# Patient Record
Sex: Female | Born: 1960 | Race: Black or African American | Hispanic: No | Marital: Married | Smoking: Never smoker
Health system: Southern US, Community
[De-identification: ages and names within clinical notes are randomized; demographics above are authoritative.]

## PROBLEM LIST (undated history)

## (undated) DIAGNOSIS — I10 Essential (primary) hypertension: Secondary | ICD-10-CM

## (undated) DIAGNOSIS — E119 Type 2 diabetes mellitus without complications: Secondary | ICD-10-CM

## (undated) HISTORY — DX: Essential (primary) hypertension: I10

## (undated) HISTORY — DX: Type 2 diabetes mellitus without complications: E11.9

## (undated) HISTORY — PX: BREAST BIOPSY: SHX20

---

## 2006-03-14 ENCOUNTER — Ambulatory Visit: Payer: Self-pay

## 2006-03-25 ENCOUNTER — Ambulatory Visit: Payer: Self-pay

## 2006-10-15 ENCOUNTER — Ambulatory Visit: Payer: Self-pay

## 2007-04-22 ENCOUNTER — Ambulatory Visit: Payer: Self-pay

## 2007-04-30 ENCOUNTER — Ambulatory Visit: Payer: Self-pay | Admitting: General Surgery

## 2007-09-14 IMAGING — MG MM ADDITIONAL VIEWS AT NO CHARGE
1 series · 5 of 5 positions shown · non-contrast
Comparison: none

REASON FOR EXAM: Right breast nodularity
                         US, if needed
COMMENTS:

[R ML · right · 5 of 5 slices shown]
[im 1/5]
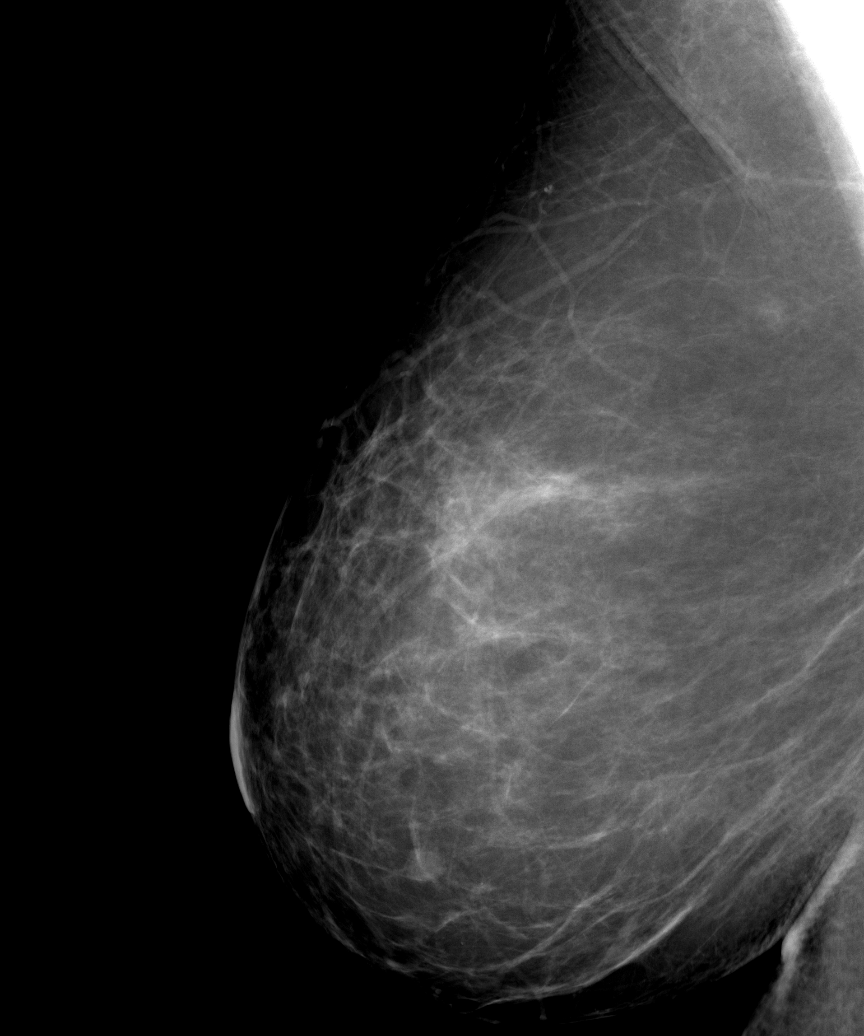
[im 2/5]
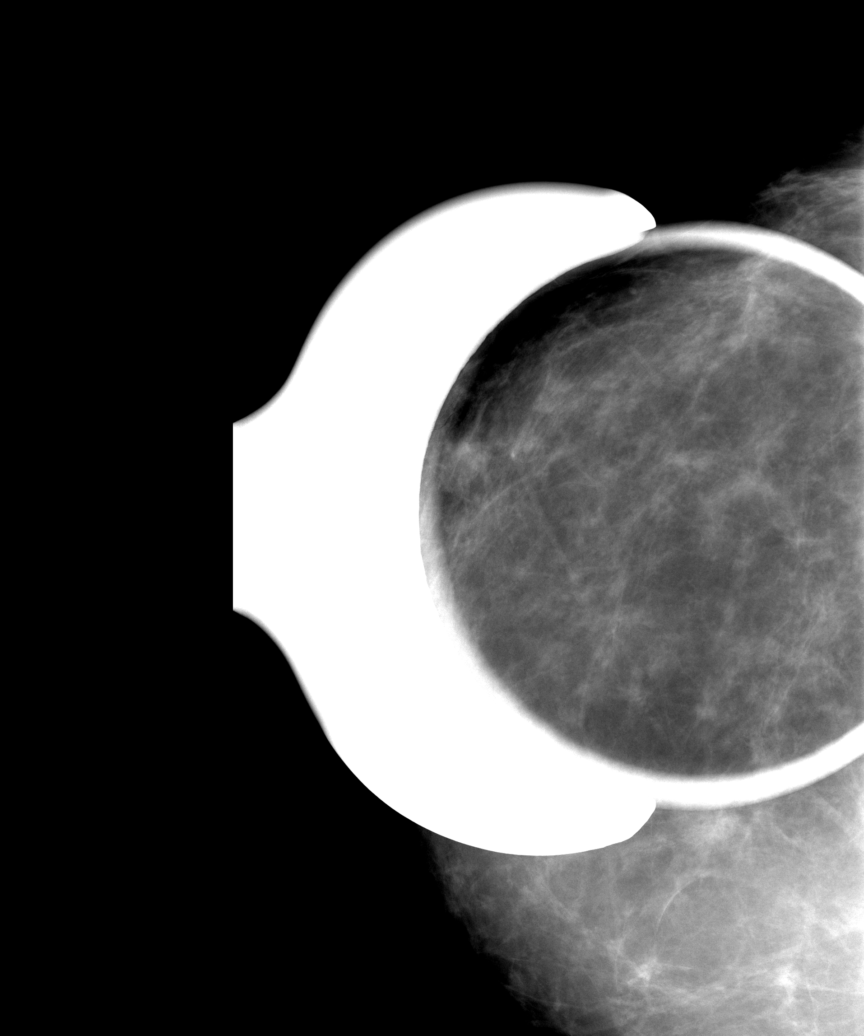
[im 3/5]
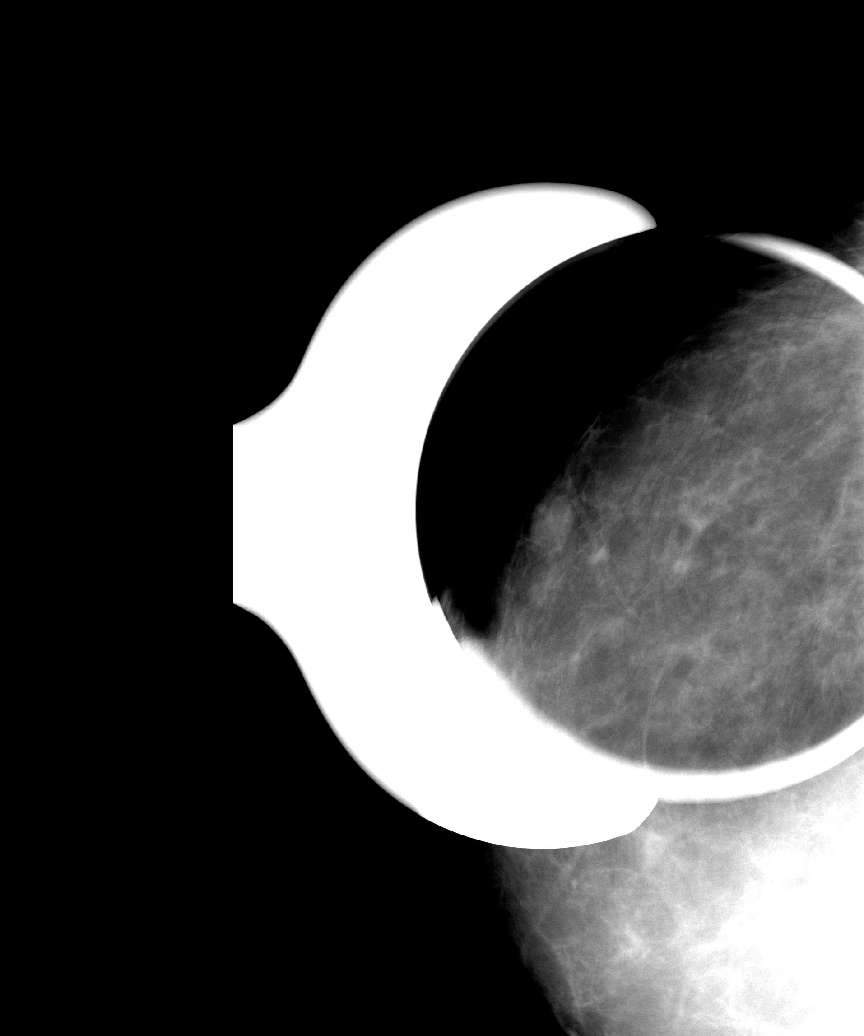
[im 4/5]
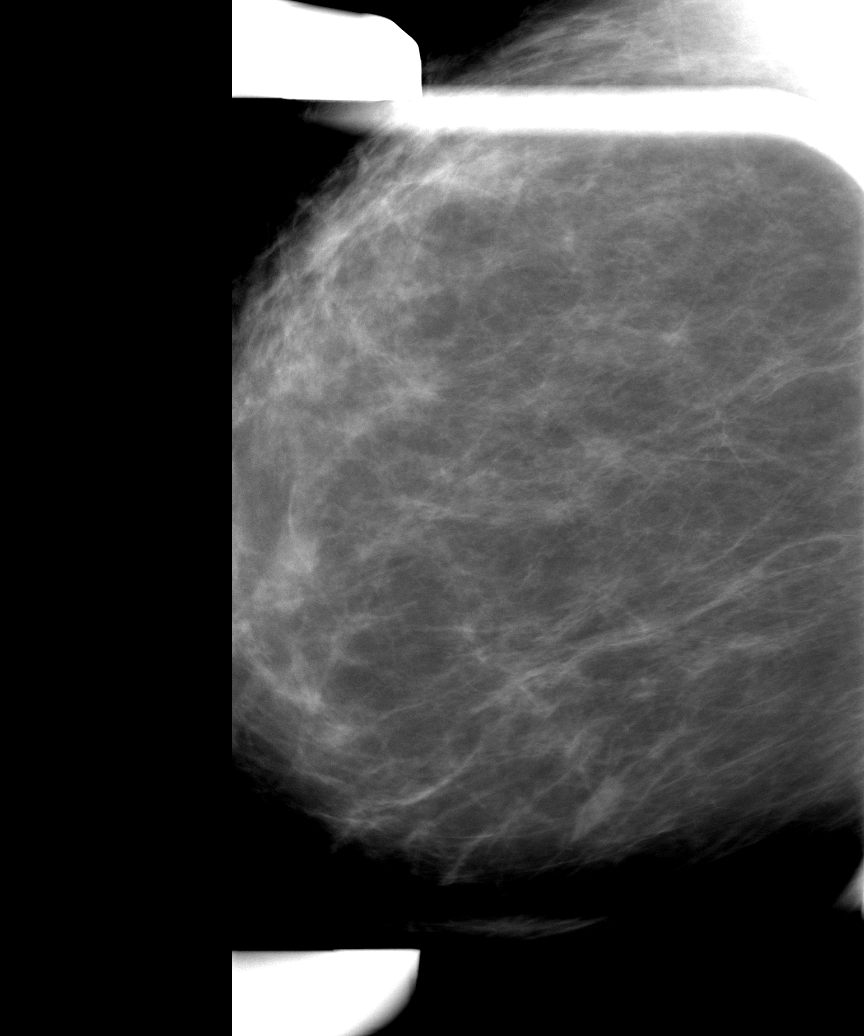
[im 5/5]
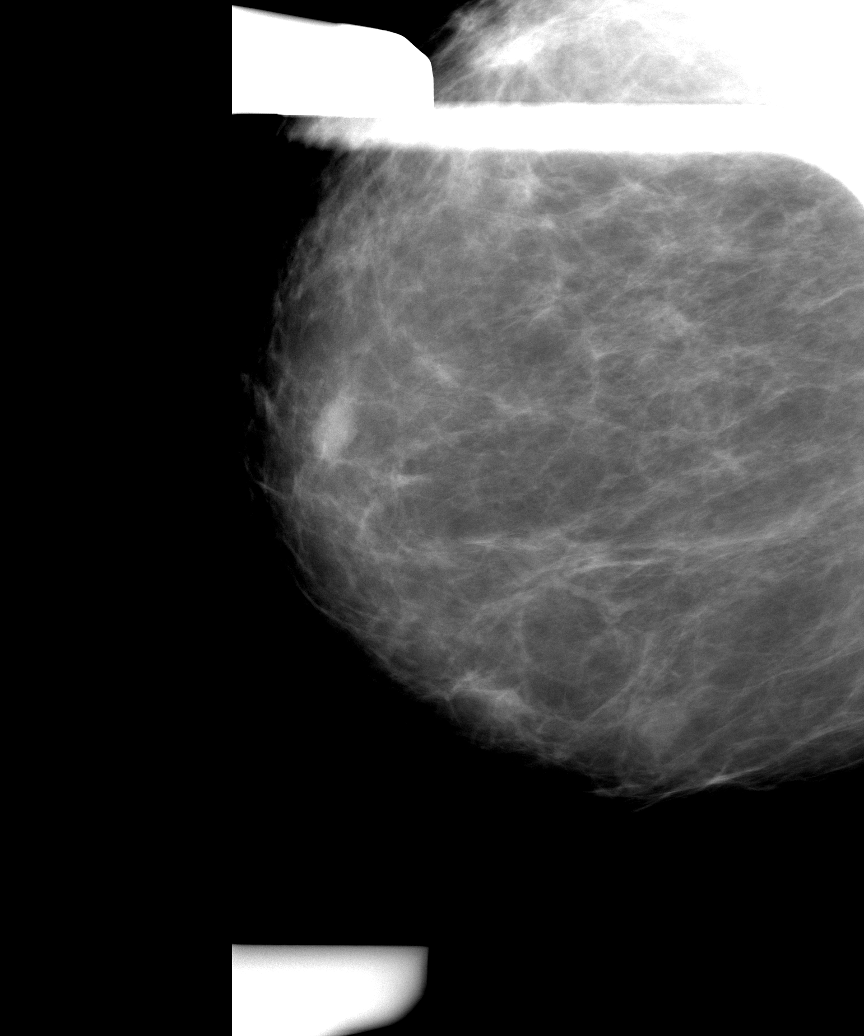

[5 of 5 positions shown; findings below may reference images not displayed]

PROCEDURE:     MAM - MAM DIG ADDVIEWS RT SCR  - March 25, 2006  [DATE]

RESULT:     Reference is made to a study of 03/14/2006 at which time
nodularity was noted in the slightly lower and outer aspect of the
periareolar region on the RIGHT. Spot magnification compression views and a
true lateral film do reveal a persistent nodule in this region today.
Ultrasound reveals a solid appearing nodule measuring approximately 6.0 mm
in dimension with some distal shadowing at approximately the 7 o'clock
position.
IMPRESSION: There is an indeterminate, solid appearing nodule on the
RIGHT in the anterior aspect of the breast in the lower outer periareolar
region. Surgical consultation is recommended. The structure is felt to be
too superficial for Stereotactic Biopsy but could be biopsied using
ultrasound guidance.

BI-RADS: Category 4 - Suspicious Abnormality. Surgical consultation is
recommended.

Thank you for this opportunity to contribute to the care of your patient.

A NEGATIVE MAMMOGRAM REPORT DOES NOT PRECLUDE BIOPSY OR OTHER EVALUATION OF
A CLINICALLY PALPABLE OR OTHERWISE SUSPICIOUS MASS OR LESION. BREAST CANCER
MAY NOT BE DETECTED BY MAMMOGRAPHY IN UP TO 10% OF CASES.

## 2010-05-18 ENCOUNTER — Emergency Department: Payer: Self-pay | Admitting: Emergency Medicine

## 2011-01-11 ENCOUNTER — Emergency Department: Payer: Self-pay | Admitting: *Deleted

## 2013-02-11 ENCOUNTER — Ambulatory Visit: Payer: Self-pay

## 2013-03-06 ENCOUNTER — Ambulatory Visit: Payer: Self-pay

## 2013-03-25 ENCOUNTER — Ambulatory Visit: Payer: Self-pay

## 2013-03-27 LAB — PATHOLOGY REPORT

## 2013-09-25 ENCOUNTER — Ambulatory Visit: Payer: Self-pay | Admitting: Surgery

## 2016-10-29 LAB — HEMOGLOBIN A1C: Hemoglobin A1C: 6.1

## 2016-10-29 LAB — LIPID PANEL: HDL: 105 — AB (ref 35–70)

## 2017-03-20 ENCOUNTER — Encounter: Payer: Self-pay | Admitting: *Deleted

## 2017-03-20 ENCOUNTER — Ambulatory Visit: Payer: Self-pay | Attending: Oncology | Admitting: *Deleted

## 2017-03-20 ENCOUNTER — Ambulatory Visit
Admission: RE | Admit: 2017-03-20 | Discharge: 2017-03-20 | Disposition: A | Discharge status: Home / Self Care | Payer: Self-pay | Source: Ambulatory Visit | Attending: Oncology | Admitting: Oncology

## 2017-03-20 ENCOUNTER — Encounter (INDEPENDENT_AMBULATORY_CARE_PROVIDER_SITE_OTHER): Payer: Self-pay

## 2017-03-20 VITALS — BP 152/81 | HR 63 | Temp 97.0°F | Resp 18 | Ht 64.0 in | Wt 181.0 lb

## 2017-03-20 DIAGNOSIS — Z0000 Encounter for general adult medical examination without abnormal findings: Secondary | ICD-10-CM

## 2017-03-20 NOTE — Patient Instructions (Signed)
HPV Test The human papillomavirus (HPV) test is used to look for high-risk types of HPV infection. HPV is a group of about 100 viruses. Many of these viruses cause growths on, in, or around the genitals. Most HPV viruses cause infections that usually go away without treatment. However, HPV types 6, 11, 16, and 18 are considered high-risk types of HPV that can increase your risk of cancer of the cervix or anus if the infection is left untreated. An HPV test identifies the DNA (genetic) strands of the HPV infection, so it is also referred to as the HPV DNA test. Although HPV is found in both males and females, the HPV test is only used to screen for increased cancer risk in females:  With an abnormal Pap test.  After treatment of an abnormal Pap test.  Between the ages of 30 and 65.  After treatment of a high-risk HPV infection.  The HPV test may be done at the same time as a pelvic exam and Pap test in females over the age of 30. Both the HPV test and Pap test require a sample of cells from the cervix. How do I prepare for this test?  Do not douche or take a bath for 24-48 hours before the test or as directed by your health care provider.  Do not have sex for 24-48 hours before the test or as directed by your health care provider.  You may be asked to reschedule the test if you are menstruating.  You will be asked to urinate before the test. What do the results mean? It is your responsibility to obtain your test results. Ask the lab or department performing the test when and how you will get your results. Talk with your health care provider if you have any questions about your results. Your result will be negative or positive. Meaning of Negative Test Results A negative HPV test result means that no HPV was found, and it is very likely that you do not have HPV. Meaning of Positive Test Results A positive HPV test result indicates that you have HPV.  If your test result shows the presence  of any high-risk HPV strains, you may have an increased risk of developing cancer of the cervix or anus if the infection is left untreated.  If any low-risk HPV strains are found, you are not likely to have an increased risk of cancer.  Discuss your test results with your health care provider. He or she will use the results to make a diagnosis and determine a treatment plan that is right for you. Talk with your health care provider to discuss your results, treatment options, and if necessary, the need for more tests. Talk with your health care provider if you have any questions about your results. This information is not intended to replace advice given to you by your health care provider. Make sure you discuss any questions you have with your health care provider. Document Released: 04/13/2004 Document Revised: 11/23/2015 Document Reviewed: 08/04/2013 Elsevier Interactive Patient Education  2018 Elsevier Inc.   Gave patient hand-out, Women Staying Healthy, Active and Well from [facility], with education on breast health, pap smears, heart and colon health.  

## 2017-03-20 NOTE — Progress Notes (Signed)
Subjective:     Patient ID: Brittany Callahan, female   DOB: February 28, 1961, 56 y.o.   MRN: [id number]  HPI   Review of Systems     Objective:   Physical Exam  Pulmonary/Chest: Right breast exhibits no inverted nipple, no mass, no nipple discharge, no skin change and no tenderness. Left breast exhibits tenderness. Left breast exhibits no inverted nipple, no mass, no nipple discharge and no skin change. Breasts are symmetrical.    Abdominal: There is no splenomegaly or hepatomegaly.  Genitourinary: No labial fusion. There is no rash, tenderness, lesion or injury on the right labia. There is no rash, tenderness, lesion or injury on the left labia. Cervix exhibits no motion tenderness, no discharge and no friability. Right adnexum displays no mass, no tenderness and no fullness. Left adnexum displays no mass, no tenderness and no fullness. No erythema, tenderness or bleeding in the vagina. No foreign body in the vagina. No signs of injury around the vagina. No vaginal discharge found.       Assessment:     56 year [facility] Black [facility].  Clinical breast exam unremarkable.  Taught self breast awareness.  Specimen collected for pap smear without difficulty.   Blood pressure elevated at 152/81.  She had just taken her blood pressure meds. She is to recheck her blood pressure at [facility] or [facility], and if remains higher than 140/90 she is to follow-up with her primary care provider.  Hand out on hypertention given to patient. Patient has been screened for eligibility.  She does not have any insurance, Medicare or Medicaid.  She also meets financial eligibility.  Hand-out given on the Affordable Care Act.    Plan:     Screening mammogram ordered.  Specimen for pap sent to the lab.  Patient would like me to call her with her results.  Will follow-up per [facility] protocol.

## 2017-03-22 LAB — PAP LB AND HPV HIGH-RISK
HPV, HIGH-RISK: NEGATIVE
PAP SMEAR COMMENT: 0

## 2017-03-27 ENCOUNTER — Encounter: Payer: Self-pay | Admitting: *Deleted

## 2017-03-27 NOTE — Progress Notes (Signed)
Letter mailed to inform patient of her normal mammogram and pap smear.  Next mammo due in one year and pap smear in 5 years.  [facility] [place].

## 2017-11-02 ENCOUNTER — Encounter: Payer: Self-pay | Admitting: Internal Medicine

## 2017-11-02 ENCOUNTER — Ambulatory Visit: Payer: Self-pay | Admitting: Internal Medicine

## 2017-11-02 VITALS — BP 169/80 | HR 77 | Temp 99.1°F | Ht 68.0 in | Wt 185.0 lb

## 2017-11-02 DIAGNOSIS — E119 Type 2 diabetes mellitus without complications: Secondary | ICD-10-CM | POA: Insufficient documentation

## 2017-11-02 DIAGNOSIS — E118 Type 2 diabetes mellitus with unspecified complications: Secondary | ICD-10-CM

## 2017-11-02 DIAGNOSIS — I10 Essential (primary) hypertension: Secondary | ICD-10-CM

## 2017-11-02 NOTE — Progress Notes (Signed)
Patient came for blood sugar check she is diabetic and is compliant with metformin. Blood glucose is 90 . Will check HBA1C and urine for microalbumin. Complains of polyuria will check urinalysis to r/o bladder infection.  Pt advised to continue her current medications.  Pt has an eye exam t6 months ago and FHX of mom had colon CA. Will be scheduled for colonoscopy. Pt reports NL PAP and mammogram.  HEENT: NCAT VS:WNL, NAD Chest: NL S1 S2, with grade 2/6 left sternal border murur. No carotid bruit no JVD. Ext: NL sensation to sharp and dull, no peripheral edema, pulses are 2+ and equal. Lungs: CTA B/L F/U in 2 weeks Subjective:    Patient here for follow-up of elevated blood pressure.  She not exercising and is not adherent to a low-salt diet.  Blood pressure is well controlled at home. Cardiac symptoms: none. Patient denies: chest pain. Cardiovascular risk factors: diabetes mellitus and hypertension. Use of agents associated with hypertension: NSAIDS. History of target organ damage: none.  The following portions of the patient's history were reviewed and updated as appropriate: allergies, current medications, past medical history, past social history and problem list.   Review of Systems Pertinent items noted in HPI and remainder of comprehensive ROS otherwise negative.     Objective:    CV exam performed today.   Assessment:    Hypertension, stage 2 . Evidence of target organ damage: none.    Plan:    Dietary sodium restriction. Regular aerobic exercise. Check blood pressures daily and record. Follow up: 2 week and as needed.

## 2017-11-04 ENCOUNTER — Other Ambulatory Visit: Payer: Self-pay | Admitting: Internal Medicine

## 2017-11-05 LAB — URINALYSIS, ROUTINE W REFLEX MICROSCOPIC
Bilirubin, UA: NEGATIVE
Glucose, UA: NEGATIVE
Ketones, UA: NEGATIVE
Leukocytes, UA: NEGATIVE
Nitrite, UA: NEGATIVE
Protein, UA: NEGATIVE
RBC, UA: NEGATIVE
Specific Gravity, UA: 1.018 (ref 1.005–1.030)
Urobilinogen, Ur: 1 mg/dL (ref 0.2–1.0)
pH, UA: 7 (ref 5.0–7.5)

## 2017-11-05 LAB — HGB A1C W/O EAG: Hgb A1c MFr Bld: 6.1 % — ABNORMAL HIGH (ref 4.8–5.6)

## 2017-11-16 ENCOUNTER — Other Ambulatory Visit: Payer: Self-pay | Admitting: Internal Medicine

## 2017-12-07 ENCOUNTER — Other Ambulatory Visit: Payer: Self-pay | Admitting: Internal Medicine

## 2017-12-21 ENCOUNTER — Other Ambulatory Visit: Payer: Self-pay | Admitting: Adult Health

## 2017-12-21 ENCOUNTER — Encounter: Payer: Self-pay | Admitting: Adult Health

## 2017-12-21 VITALS — BP 134/71 | HR 65 | Temp 98.2°F | Ht 65.0 in | Wt 182.4 lb

## 2017-12-21 DIAGNOSIS — I10 Essential (primary) hypertension: Secondary | ICD-10-CM

## 2017-12-21 DIAGNOSIS — I152 Hypertension secondary to endocrine disorders: Secondary | ICD-10-CM | POA: Insufficient documentation

## 2017-12-21 DIAGNOSIS — R7303 Prediabetes: Secondary | ICD-10-CM | POA: Insufficient documentation

## 2017-12-21 MED ORDER — METFORMIN HCL 500 MG PO TABS: 500.0000 mg | ORAL_TABLET | Freq: Every day | ORAL | 6 refills | Status: AC

## 2017-12-21 MED ORDER — LISINOPRIL-HYDROCHLOROTHIAZIDE 20-12.5 MG PO TABS: 1.0000 | ORAL_TABLET | Freq: Every day | ORAL | 6 refills | Status: AC

## 2017-12-21 NOTE — Progress Notes (Signed)
Brittany Callahan is an 57 y.o. Callahan.    Chief Complaint: follow up on prediabetes and HTN  HPI: Brittany Callahan here for follow up on prediabetes and HTN  Past Medical History:  Diagnosis Date  . Diabetes mellitus without complication (HCC)   . High blood pressure     Past Surgical History:  Procedure Laterality Date  . BREAST BIOPSY Left 03/25/2017   PAPILLARY APOCRINE CHANGE INVOLVING A MICROCYST CLUSTER    Family History  Problem Relation Age of Onset  . Dementia Mother   . Diabetes Mother   . Hypertension Mother    Social History:  reports that she has never smoked. She quit smokeless tobacco use about 28 years ago.  Her smokeless tobacco use included chew. Her alcohol and drug histories are not on file.  Allergies:  Allergies  Allergen Reactions  . Other     Seasonal     (Not in a hospital admission)  No results found for this or any previous visit (from the past 48 hour(s)). No results found.  Review of Systems  Constitutional: Negative.   Respiratory: Negative.   Cardiovascular: Negative.   Gastrointestinal: Negative.   Musculoskeletal: Negative.   Skin: Negative.   Neurological: Negative.   Endo/Heme/Allergies: Negative.   Psychiatric/Behavioral: Negative.     Blood pressure 134/71, pulse 65, temperature 98.2 F (36.8 C), height 5\' 5"  (1.651 m), weight 182 lb 6.4 oz (82.7 kg), SpO2 98 %.   Physical Exam  Constitutional: She is oriented to person, place, and time. She appears well-developed and well-nourished. No distress.  HENT:  Head: Normocephalic and atraumatic.  Neck: Normal range of motion. Neck supple.  Cardiovascular: Normal rate and regular rhythm.  Respiratory: Breath sounds normal.  GI: Soft.  Musculoskeletal: Normal range of motion.  Neurological: She is alert and oriented to person, place, and time.  Skin: Skin is dry.  Psychiatric: She has a normal mood and affect. Her behavior is normal. Judgment and thought content normal.      Assessment/Plan  1. Prediabetes Slightly hypoglycemic this morning. Pt has not eaten anything. Instructed on diet and exercise. Continue metformin for now. RTC in 6 months or sooner if needed  - metFORMIN (GLUCOPHAGE) 500 MG tablet; Take 1 tablet (500 mg total) by mouth daily with breakfast.  Dispense: 1 tablet; Refill: 6  2. Essential hypertension Stable on medication. She is compliant with medication. No reported SE. RTC in 6 months or sooner if needed.  - lisinopril-hydrochlorothiazide (PRINZIDE,ZESTORETIC) 20-12.5 MG tablet; Take 1 tablet by mouth daily.  Dispense: 1 tablet; Refill: 6   Brittany Parker, NP-C 12/21/17, 12:17 PM

## 2017-12-21 NOTE — Patient Instructions (Addendum)
Diabetes Mellitus and Exercise Exercising regularly is important for your overall health, especially when you have diabetes (diabetes mellitus). Exercising is not only about losing weight. It has many health benefits, such as increasing muscle strength and bone density and reducing body fat and stress. This leads to improved fitness, flexibility, and endurance, all of which result in better overall health. Exercise has additional benefits for people with diabetes, including:  Reducing appetite.  Helping to lower and control blood glucose.  Lowering blood pressure.  Helping to control amounts of fatty substances (lipids) in the blood, such as cholesterol and triglycerides.  Helping the body to respond better to insulin (improving insulin sensitivity).  Reducing how much insulin the body needs.  Decreasing the risk for heart disease by: ? Lowering cholesterol and triglyceride levels. ? Increasing the levels of good cholesterol. ? Lowering blood glucose levels.  What is my activity plan? Your health care provider or certified diabetes educator can help you make a plan for the type and frequency of exercise (activity plan) that works for you. Make sure that you:  Do at least 150 minutes of moderate-intensity or vigorous-intensity exercise each week. This could be brisk walking, biking, or water aerobics. ? Do stretching and strength exercises, such as yoga or weightlifting, at least 2 times a week. ? Spread out your activity over at least 3 days of the week.  Get some form of physical activity every day. ? Do not go more than 2 days in a row without some kind of physical activity. ? Avoid being inactive for more than 90 minutes at a time. Take frequent breaks to walk or stretch.  Choose a type of exercise or activity that you enjoy, and set realistic goals.  Start slowly, and gradually increase the intensity of your exercise over time.  What do I need to know about managing my  diabetes?  Check your blood glucose before and after exercising. ? If your blood glucose is higher than 240 mg/dL (13.3 mmol/L) before you exercise, check your urine for ketones. If you have ketones in your urine, do not exercise until your blood glucose returns to normal.  Know the symptoms of low blood glucose (hypoglycemia) and how to treat it. Your risk for hypoglycemia increases during and after exercise. Common symptoms of hypoglycemia can include: ? Hunger. ? Anxiety. ? Sweating and feeling clammy. ? Confusion. ? Dizziness or feeling light-headed. ? Increased heart rate or palpitations. ? Blurry vision. ? Tingling or numbness around the mouth, lips, or tongue. ? Tremors or shakes. ? Irritability.  Keep a rapid-acting carbohydrate snack available before, during, and after exercise to help prevent or treat hypoglycemia.  Avoid injecting insulin into areas of the body that are going to be exercised. For example, avoid injecting insulin into: ? The arms, when playing tennis. ? The legs, when jogging.  Keep records of your exercise habits. Doing this can help you and your health care provider adjust your diabetes management plan as needed. Write down: ? Food that you eat before and after you exercise. ? Blood glucose levels before and after you exercise. ? The type and amount of exercise you have done. ? When your insulin is expected to peak, if you use insulin. Avoid exercising at times when your insulin is peaking.  When you start a new exercise or activity, work with your health care provider to make sure the activity is safe for you, and to adjust your insulin, medicines, or food intake as needed.    Drink plenty of water while you exercise to prevent dehydration or heat stroke. Drink enough fluid to keep your urine clear or pale yellow. This information is not intended to replace advice given to you by your health care provider. Make sure you discuss any questions you have with  your health care provider. Document Released: 06/09/2003 Document Revised: 10/07/2015 Document Reviewed: 08/29/2015 Elsevier Interactive Patient Education  2018 Elsevier Inc.    Diabetes Mellitus and Nutrition When you have diabetes (diabetes mellitus), it is very important to have healthy eating habits because your blood sugar (glucose) levels are greatly affected by what you eat and drink. Eating healthy foods in the appropriate amounts, at about the same times every day, can help you: Control your blood glucose. Lower your risk of heart disease. Improve your blood pressure. Reach or maintain a healthy weight.  Every person with diabetes is different, and each person has different needs for a meal plan. Your health care provider may recommend that you work with a diet and nutrition specialist (dietitian) to make a meal plan that is best for you. Your meal plan may vary depending on factors such as: The calories you need. The medicines you take. Your weight. Your blood glucose, blood pressure, and cholesterol levels. Your activity level. Other health conditions you have, such as heart or kidney disease.  How do carbohydrates affect me? Carbohydrates affect your blood glucose level more than any other type of food. Eating carbohydrates naturally increases the amount of glucose in your blood. Carbohydrate counting is a method for keeping track of how many carbohydrates you eat. Counting carbohydrates is important to keep your blood glucose at a healthy level, especially if you use insulin or take certain oral diabetes medicines. It is important to know how many carbohydrates you can safely have in each meal. This is different for every person. Your dietitian can help you calculate how many carbohydrates you should have at each meal and for snack. Foods that contain carbohydrates include: Bread, cereal, rice, pasta, and crackers. Potatoes and corn. Peas, beans, and lentils. Milk and  yogurt. Fruit and juice. Desserts, such as cakes, cookies, ice cream, and candy.  How does alcohol affect me? Alcohol can cause a sudden decrease in blood glucose (hypoglycemia), especially if you use insulin or take certain oral diabetes medicines. Hypoglycemia can be a life-threatening condition. Symptoms of hypoglycemia (sleepiness, dizziness, and confusion) are similar to symptoms of having too much alcohol. If your health care provider says that alcohol is safe for you, follow these guidelines: Limit alcohol intake to no more than 1 drink per day for nonpregnant women and 2 drinks per day for men. One drink equals 12 oz of beer, 5 oz of wine, or 1 oz of hard liquor. Do not drink on an empty stomach. Keep yourself hydrated with water, diet soda, or unsweetened iced tea. Keep in mind that regular soda, juice, and other mixers may contain a lot of sugar and must be counted as carbohydrates.  What are tips for following this plan? Reading food labels Start by checking the serving size on the label. The amount of calories, carbohydrates, fats, and other nutrients listed on the label are based on one serving of the food. Many foods contain more than one serving per package. Check the total grams (g) of carbohydrates in one serving. You can calculate the number of servings of carbohydrates in one serving by dividing the total carbohydrates by 15. For example, if a food has 30 g   of total carbohydrates, it would be equal to 2 servings of carbohydrates. Check the number of grams (g) of saturated and trans fats in one serving. Choose foods that have low or no amount of these fats. Check the number of milligrams (mg) of sodium in one serving. Most people should limit total sodium intake to less than 2,300 mg per day. Always check the nutrition information of foods labeled as "low-fat" or "nonfat". These foods may be higher in added sugar or refined carbohydrates and should be avoided. Talk to your  dietitian to identify your daily goals for nutrients listed on the label. Shopping Avoid buying canned, premade, or processed foods. These foods tend to be high in fat, sodium, and added sugar. Shop around the outside edge of the grocery store. This includes fresh fruits and vegetables, bulk grains, fresh meats, and fresh dairy. Cooking Use low-heat cooking methods, such as baking, instead of high-heat cooking methods like deep frying. Cook using healthy oils, such as olive, canola, or sunflower oil. Avoid cooking with butter, cream, or high-fat meats. Meal planning Eat meals and snacks regularly, preferably at the same times every day. Avoid going long periods of time without eating. Eat foods high in fiber, such as fresh fruits, vegetables, beans, and whole grains. Talk to your dietitian about how many servings of carbohydrates you can eat at each meal. Eat 4-6 ounces of lean protein each day, such as lean meat, chicken, fish, eggs, or tofu. 1 ounce is equal to 1 ounce of meat, chicken, or fish, 1 egg, or 1/4 cup of tofu. Eat some foods each day that contain healthy fats, such as avocado, nuts, seeds, and fish. Lifestyle  Check your blood glucose regularly. Exercise at least 30 minutes 5 or more days each week, or as told by your health care provider. Take medicines as told by your health care provider. Do not use any products that contain nicotine or tobacco, such as cigarettes and e-cigarettes. If you need help quitting, ask your health care provider. Work with a counselor or diabetes educator to identify strategies to manage stress and any emotional and social challenges. What are some questions to ask my health care provider? Do I need to meet with a diabetes educator? Do I need to meet with a dietitian? What number can I call if I have questions? When are the best times to check my blood glucose? Where to find more information: American Diabetes Association:  diabetes.org/food-and-fitness/food Academy of Nutrition and Dietetics: www.eatright.org/resources/health/diseases-and-conditions/diabetes National Institute of Diabetes and Digestive and Kidney Diseases (NIH): www.niddk.nih.gov/health-information/diabetes/overview/diet-eating-physical-activity Summary A healthy meal plan will help you control your blood glucose and maintain a healthy lifestyle. Working with a diet and nutrition specialist (dietitian) can help you make a meal plan that is best for you. Keep in mind that carbohydrates and alcohol have immediate effects on your blood glucose levels. It is important to count carbohydrates and to use alcohol carefully. This information is not intended to replace advice given to you by your health care provider. Make sure you discuss any questions you have with your health care provider. Document Released: 12/14/2004 Document Revised: 04/23/2016 Document Reviewed: 04/23/2016 Elsevier Interactive Patient Education  2018 Elsevier Inc.  

## 2018-02-22 ENCOUNTER — Ambulatory Visit
Admission: EM | Admit: 2018-02-22 | Discharge: 2018-02-22 | Disposition: A | Discharge status: Home / Self Care | Payer: No Typology Code available for payment source | Attending: Family Medicine | Admitting: Family Medicine

## 2018-02-22 ENCOUNTER — Encounter: Payer: Self-pay | Admitting: Gynecology

## 2018-02-22 ENCOUNTER — Other Ambulatory Visit: Payer: Self-pay

## 2018-02-22 DIAGNOSIS — B029 Zoster without complications: Secondary | ICD-10-CM

## 2018-02-22 HISTORY — DX: Zoster without complications: B02.9

## 2018-02-22 MED ORDER — VALACYCLOVIR HCL 1 G PO TABS: 1000.0000 mg | ORAL_TABLET | Freq: Three times a day (TID) | ORAL | 0 refills | Status: DC

## 2018-02-22 NOTE — ED Provider Notes (Signed)
[facility]    CSN: [id number] Arrival date & time: 02/22/18  0846     History   Chief Complaint Chief Complaint  Patient presents with  . Rash    HPI Brittany Callahan is a 57 y.o. female.   57 yo female with a c/o painful, burning rash on back and arm for the past week. States she's been putting calamine lotion. Denies any fevers, chills.   The history is provided by the patient.  Rash    Past Medical History:  Diagnosis Date  . Diabetes mellitus without complication (HCC)   . High blood pressure     Patient Active Problem List   Diagnosis Date Noted  . Essential hypertension 12/21/2017  . Prediabetes 12/21/2017  . Diabetes (HCC) 11/02/2017    Past Surgical History:  Procedure Laterality Date  . BREAST BIOPSY Left 03/25/2017   PAPILLARY APOCRINE CHANGE INVOLVING A MICROCYST CLUSTER    OB History   None      Home Medications    Prior to Admission medications   Medication Sig Start Date End Date Taking? Authorizing Provider  lisinopril-hydrochlorothiazide (PRINZIDE,ZESTORETIC) 20-12.5 MG tablet Take 1 tablet by mouth daily. 12/21/17  Yes Rey, Richarda Overlie, NP  metFORMIN (GLUCOPHAGE) 500 MG tablet Take 1 tablet (500 mg total) by mouth daily with breakfast. 12/21/17  Yes Rey, Raquel M, NP  valACYclovir (VALTREX) 1000 MG tablet Take 1 tablet (1,000 mg total) by mouth 3 (three) times daily. 02/22/18   Payton Mccallum, MD    Family History Family History  Problem Relation Age of Onset  . Dementia Mother   . Diabetes Mother   . Hypertension Mother     Social History Social History   Tobacco Use  . Smoking status: Never Smoker  . Smokeless tobacco: Former Neurosurgeon    Types: Chew  Substance Use Topics  . Alcohol use: Never    Frequency: Never  . Drug use: Never     Allergies   Other   Review of Systems Review of Systems  Skin: Positive for rash.     Physical Exam Triage Vital Signs ED Triage Vitals  Enc Vitals Group     BP 02/22/18  0858 (!) 145/79     Pulse Rate 02/22/18 0858 82     Resp 02/22/18 0858 16     Temp 02/22/18 0858 98.3 F (36.8 C)     Temp Source 02/22/18 0858 Oral     SpO2 02/22/18 0858 99 %     Weight 02/22/18 0859 175 lb (79.4 kg)     Height 02/22/18 0859 5\' 8"  (1.727 m)     Head Circumference --      Peak Flow --      Pain Score 02/22/18 0858 7     Pain Loc --      Pain Edu? --      Excl. in GC? --    No data found.  Updated Vital Signs BP (!) 145/79 (BP Location: Left Arm)   Pulse 82   Temp 98.3 F (36.8 C) (Oral)   Resp 16   Ht 5\' 8"  (1.727 m)   Wt 79.4 kg   SpO2 99%   BMI 26.61 kg/m   Visual Acuity Right Eye Distance:   Left Eye Distance:   Bilateral Distance:    Right Eye Near:   Left Eye Near:    Bilateral Near:     Physical Exam  Constitutional: She appears well-developed and well-nourished. No distress.  Skin: Rash noted. Rash is vesicular (on back and arm along dermatomal patterns). She is not diaphoretic.     Nursing note and vitals reviewed.    UC Treatments / Results  Labs (all labs ordered are listed, but only abnormal results are displayed) Labs Reviewed - No data to display  EKG None  Radiology No results found.  Procedures Procedures (including critical care time)  Medications Ordered in UC Medications - No data to display  Initial Impression / Assessment and Plan / UC Course  I have reviewed the triage vital signs and the nursing notes.  Pertinent labs & imaging results that were available during my care of the patient were reviewed by me and considered in my medical decision making (see chart for details).      Final Clinical Impressions(s) / UC Diagnoses   Final diagnoses:  Herpes zoster without complication    ED Prescriptions    Medication Sig Dispense Auth. Provider   valACYclovir (VALTREX) 1000 MG tablet Take 1 tablet (1,000 mg total) by mouth 3 (three) times daily. 30 tablet Payton Mccallumonty, Gleason Ardoin, MD     1. diagnosis reviewed  with patient 2. rx as per orders above; reviewed possible side effects, interactions, risks and benefits  3. Recommend supportive treatment with otc analgesics 4. Follow-up prn if symptoms worsen or don't improve   Controlled Substance Prescriptions [place] Controlled Substance Registry consulted? Not Applicable   Payton Mccallumonty, Jurney Overacker, MD 02/22/18 [phone]

## 2018-02-22 NOTE — ED Triage Notes (Signed)
Patient c/o painful rash on arm and back x 1 week ago.

## 2018-04-07 ENCOUNTER — Ambulatory Visit
Admission: RE | Admit: 2018-04-07 | Discharge: 2018-04-07 | Disposition: A | Discharge status: Home / Self Care | Payer: No Typology Code available for payment source | Source: Ambulatory Visit | Attending: Adult Health | Admitting: Adult Health

## 2018-04-07 ENCOUNTER — Other Ambulatory Visit: Payer: Self-pay | Admitting: Adult Health

## 2018-04-07 ENCOUNTER — Ambulatory Visit: Payer: No Typology Code available for payment source

## 2018-04-07 DIAGNOSIS — Z1231 Encounter for screening mammogram for malignant neoplasm of breast: Secondary | ICD-10-CM | POA: Diagnosis present

## 2018-04-19 ENCOUNTER — Ambulatory Visit: Payer: Self-pay | Admitting: Internal Medicine

## 2018-06-07 ENCOUNTER — Ambulatory Visit: Payer: Self-pay | Admitting: Internal Medicine

## 2018-06-07 ENCOUNTER — Encounter: Payer: Self-pay | Admitting: Internal Medicine

## 2018-06-07 ENCOUNTER — Other Ambulatory Visit: Payer: Self-pay

## 2018-06-07 VITALS — BP 149/66 | HR 67 | Temp 98.2°F | Ht 66.0 in | Wt 182.2 lb

## 2018-06-07 DIAGNOSIS — I10 Essential (primary) hypertension: Secondary | ICD-10-CM

## 2018-06-07 DIAGNOSIS — E118 Type 2 diabetes mellitus with unspecified complications: Secondary | ICD-10-CM

## 2018-06-07 DIAGNOSIS — B356 Tinea cruris: Secondary | ICD-10-CM

## 2018-06-07 MED ORDER — FLUCONAZOLE 150 MG PO TABS: 150.0000 mg | ORAL_TABLET | Freq: Once | ORAL | 0 refills | Status: AC

## 2018-06-07 NOTE — Progress Notes (Signed)
Established Patient Office Visit  Subjective:  Patient ID: Brittany Callahan, female    DOB: May 07, 1960  Age: 58 y.o. MRN: [id number]  CC:  Chief Complaint  Patient presents with  . Vaginal Itching    itching and burning when urinating    HPI Brittany Callahan presents for dysuria, frequency and suprapubic discomfort. Also c/o vaginal itching and burning similar to previous "yeast infections".  Past Medical History:  Diagnosis Date  . Diabetes mellitus without complication ([place])   . High blood pressure     Past Surgical History:  Procedure Laterality Date  . BREAST BIOPSY Left 03/25/2017   PAPILLARY APOCRINE CHANGE INVOLVING A MICROCYST CLUSTER    Family History  Problem Relation Age of Onset  . Dementia Mother   . Diabetes Mother   . Hypertension Mother     Social History   Socioeconomic History  . Marital status: Married    Spouse name: Not on file  . Number of children: Not on file  . Years of education: Not on file  . Highest education level: Not on file  Occupational History  . Not on file  Social Needs  . Financial resource strain: Not on file  . Food insecurity:    Worry: Not on file    Inability: Not on file  . Transportation needs:    Medical: Not on file    Non-medical: Not on file  Tobacco Use  . Smoking status: Never Smoker  . Smokeless tobacco: Former Systems developer    Types: Chew  Substance and Sexual Activity  . Alcohol use: Never    Frequency: Never  . Drug use: Never  . Sexual activity: Not on file  Lifestyle  . Physical activity:    Days per week: Not on file    Minutes per session: Not on file  . Stress: Not on file  Relationships  . Social connections:    Talks on phone: Not on file    Gets together: Not on file    Attends religious service: Not on file    Active member of club or organization: Not on file    Attends meetings of clubs or organizations: Not on file    Relationship status: Not on file  . Intimate partner violence:    Fear  of current or ex partner: Not on file    Emotionally abused: Not on file    Physically abused: Not on file    Forced sexual activity: Not on file  Other Topics Concern  . Not on file  Social History Narrative  . Not on file    Outpatient Medications Prior to Visit  Medication Sig Dispense Refill  . lisinopril-hydrochlorothiazide (PRINZIDE,ZESTORETIC) 20-12.5 MG tablet Take 1 tablet by mouth daily. 1 tablet 6  . metFORMIN (GLUCOPHAGE) 500 MG tablet Take 1 tablet (500 mg total) by mouth daily with breakfast. 1 tablet 6  . valACYclovir (VALTREX) 1000 MG tablet Take 1 tablet (1,000 mg total) by mouth 3 (three) times daily. 30 tablet 0   No facility-administered medications prior to visit.     Allergies  Allergen Reactions  . Other     Seasonal    ROS Review of Systems    Objective:    Physical Exam  BP (!) 149/66   Pulse 67   Temp 98.2 F (36.8 C)   Ht '5\' 6"'$  (1.676 m)   Wt 182 lb 3.2 oz (82.6 kg)   SpO2 98%   BMI 29.41 kg/m  Wt  Readings from Last 3 Encounters:  06/07/18 182 lb 3.2 oz (82.6 kg)  05/18/17 188 lb (85.3 kg)  02/22/18 175 lb (79.4 kg)     Health Maintenance Due  Topic Date Due  . Hepatitis C Screening  1960-10-20  . PNEUMOCOCCAL POLYSACCHARIDE VACCINE AGE 40-64 HIGH RISK  04/24/1963  . FOOT EXAM  04/24/1971  . OPHTHALMOLOGY EXAM  04/24/1971  . HIV Screening  04/23/1976  . TETANUS/TDAP  04/23/1980  . COLONOSCOPY  04/24/2011  . INFLUENZA VACCINE  10/31/2017  . HEMOGLOBIN A1C  05/07/2018    There are no preventive care reminders to display for this patient.  No results found for: TSH No results found for: WBC, HGB, HCT, MCV, PLT No results found for: NA, K, CHLORIDE, CO2, GLUCOSE, BUN, CREATININE, BILITOT, ALKPHOS, AST, ALT, PROT, ALBUMIN, CALCIUM, ANIONGAP, EGFR, GFR No results found for: CHOL No results found for: HDL No results found for: LDLCALC No results found for: TRIG No results found for: CHOLHDL Lab Results  Component Value Date    HGBA1C 6.1 (H) 11/04/2017      Assessment & Plan:   Problem List Items Addressed This Visit    None     Ordered UA and STD screen. Treat for Candida No orders of the defined types were placed in this encounter.   Follow-up: No follow-ups on file.    Volanda Napoleon, MD

## 2018-06-09 ENCOUNTER — Other Ambulatory Visit: Payer: Self-pay | Admitting: Internal Medicine

## 2018-06-11 LAB — MICROSCOPIC EXAMINATION: CASTS: NONE SEEN /LPF

## 2018-06-11 LAB — URINALYSIS, ROUTINE W REFLEX MICROSCOPIC
Bilirubin, UA: NEGATIVE
Glucose, UA: NEGATIVE
Ketones, UA: NEGATIVE
Nitrite, UA: NEGATIVE
PH UA: 6 (ref 5.0–7.5)
Protein, UA: NEGATIVE
RBC, UA: NEGATIVE
Specific Gravity, UA: 1.017 (ref 1.005–1.030)
Urobilinogen, Ur: 0.2 mg/dL (ref 0.2–1.0)

## 2018-06-11 LAB — GONOCOCCUS DNA, PCR: Neisseria gonorrhoeae by PCR: NEGATIVE

## 2018-06-16 ENCOUNTER — Telehealth: Payer: Self-pay | Admitting: Internal Medicine

## 2018-06-16 NOTE — Telephone Encounter (Signed)
Called the patient inregards of her recent lab results. After talking to Dr. Ellsworth Lennox he recommended for patient Fluconazole for a yeast infection.  Pt. already has presecription with her from her visit on the 7th of March at the clinic.    Incoming call

## 2018-06-21 ENCOUNTER — Ambulatory Visit: Payer: Self-pay | Admitting: Internal Medicine

## 2018-07-05 ENCOUNTER — Ambulatory Visit: Payer: Self-pay | Admitting: Cardiology

## 2018-09-06 ENCOUNTER — Ambulatory Visit: Payer: Self-pay | Admitting: Cardiology

## 2018-09-26 ENCOUNTER — Other Ambulatory Visit: Payer: Self-pay | Admitting: Internal Medicine

## 2018-11-06 ENCOUNTER — Encounter: Payer: Self-pay | Admitting: Gastroenterology

## 2018-11-06 DIAGNOSIS — E119 Type 2 diabetes mellitus without complications: Secondary | ICD-10-CM | POA: Insufficient documentation

## 2018-11-06 DIAGNOSIS — E559 Vitamin D deficiency, unspecified: Secondary | ICD-10-CM | POA: Insufficient documentation

## 2018-11-28 ENCOUNTER — Telehealth: Payer: Self-pay | Admitting: *Deleted

## 2018-11-28 NOTE — Telephone Encounter (Signed)
Call placed to # on file x3 unable to leave message or reach pt. Procedure and nurse visit and procedure will be cancelled and letter sent.

## 2018-12-12 ENCOUNTER — Encounter: Payer: No Typology Code available for payment source | Admitting: Gastroenterology

## 2019-12-28 DIAGNOSIS — R809 Proteinuria, unspecified: Secondary | ICD-10-CM | POA: Insufficient documentation

## 2020-01-04 ENCOUNTER — Other Ambulatory Visit: Payer: Self-pay

## 2020-01-04 ENCOUNTER — Telehealth (INDEPENDENT_AMBULATORY_CARE_PROVIDER_SITE_OTHER): Payer: Self-pay | Admitting: Gastroenterology

## 2020-01-04 DIAGNOSIS — Z1211 Encounter for screening for malignant neoplasm of colon: Secondary | ICD-10-CM

## 2020-01-04 DIAGNOSIS — Z800 Family history of malignant neoplasm of digestive organs: Secondary | ICD-10-CM

## 2020-01-04 MED ORDER — NA SULFATE-K SULFATE-MG SULF 17.5-3.13-1.6 GM/177ML PO SOLN: 1.0000 | Freq: Once | ORAL | 0 refills | Status: AC

## 2020-01-04 NOTE — Progress Notes (Signed)
Gastroenterology Pre-Procedure Review  Request Date: Tuesday 01/26/20 Requesting Physician: Dr. Bonna Gains  PATIENT REVIEW QUESTIONS: The patient responded to the following health history questions as indicated:    1. Are you having any GI issues? no 2. Do you have a personal history of Polyps? no 3. Do you have a family history of Colon Cancer or Polyps? yes (mom colon cancer, sister colon polyps) 4. Diabetes Mellitus? No 5. Joint replacements in the past 12 months?no 6. Major health problems in the past 3 months?no 7. Any artificial heart valves, MVP, or defibrillator?no    MEDICATIONS & ALLERGIES:    Patient reports the following regarding taking any anticoagulation/antiplatelet therapy:   Plavix, Coumadin, Eliquis, Xarelto, Lovenox, Pradaxa, Brilinta, or Effient? no Aspirin? no  Patient confirms/reports the following medications:  Current Outpatient Medications  Medication Sig Dispense Refill  . Calcium 500-125 MG-UNIT TABS Take by mouth.    . Cholecalciferol 50 MCG (2000 UT) TABS Take by mouth.    . Garlic Oil 3552 MG CAPS Take by mouth.    Marland Kitchen lisinopril-hydrochlorothiazide (PRINZIDE,ZESTORETIC) 20-12.5 MG tablet Take 1 tablet by mouth daily. 1 tablet 6  . metFORMIN (GLUCOPHAGE) 500 MG tablet Take 1 tablet (500 mg total) by mouth daily with breakfast. 1 tablet 6  . Multiple Vitamins-Minerals (HAIR SKIN NAILS PO) Take 1 tablet by mouth daily.    . Omega-3 Fatty Acids (FISH OIL) 1000 MG CAPS Take 3 capsules by mouth daily.    . rosuvastatin (CRESTOR) 40 MG tablet Take by mouth.    . Na Sulfate-K Sulfate-Mg Sulf 17.5-3.13-1.6 GM/177ML SOLN Take 1 kit by mouth once for 1 dose. 354 mL 0  . valACYclovir (VALTREX) 1000 MG tablet Take 1 tablet (1,000 mg total) by mouth 3 (three) times daily. (Patient not taking: Reported on 01/04/2020) 30 tablet 0   No current facility-administered medications for this visit.    Patient confirms/reports the following allergies:  Allergies  Allergen  Reactions  . Other     Seasonal    No orders of the defined types were placed in this encounter.   AUTHORIZATION INFORMATION Primary Insurance: 1D#: Group #:  Secondary Insurance: 1D#: Group #:  SCHEDULE INFORMATION: Date: Tuesday 01/26/20 Time: Location:[facility]

## 2020-01-20 ENCOUNTER — Other Ambulatory Visit: Payer: Self-pay

## 2020-01-20 ENCOUNTER — Encounter: Payer: Self-pay | Admitting: Gastroenterology

## 2020-01-22 ENCOUNTER — Other Ambulatory Visit: Payer: Self-pay

## 2020-01-22 ENCOUNTER — Other Ambulatory Visit
Admission: RE | Admit: 2020-01-22 | Discharge: 2020-01-22 | Disposition: A | Discharge status: Home / Self Care | Payer: No Typology Code available for payment source | Source: Ambulatory Visit | Attending: Gastroenterology | Admitting: Gastroenterology

## 2020-01-22 DIAGNOSIS — Z01812 Encounter for preprocedural laboratory examination: Secondary | ICD-10-CM | POA: Insufficient documentation

## 2020-01-22 DIAGNOSIS — Z20822 Contact with and (suspected) exposure to covid-19: Secondary | ICD-10-CM | POA: Diagnosis not present

## 2020-01-22 NOTE — Anesthesia Preprocedure Evaluation (Addendum)
Anesthesia Evaluation  Patient identified by MRN, date of birth, ID band Patient awake    Reviewed: Allergy & Precautions, NPO status , Patient's Chart, lab work & pertinent test results, reviewed documented beta blocker date and time   History of Anesthesia Complications Negative for: history of anesthetic complications  Airway Mallampati: III  TM Distance: >3 FB Neck ROM: Full    Dental  (+)    Pulmonary    breath sounds clear to auscultation       Cardiovascular hypertension, (-) angina(-) DOE  Rhythm:Regular Rate:Normal   HLD   Neuro/Psych    GI/Hepatic neg GERD  ,  Endo/Other  diabetes, Type 2  Renal/GU      Musculoskeletal   Abdominal (+) + obese (BMI 32),   Peds  Hematology   Anesthesia Other Findings   Reproductive/Obstetrics                            Anesthesia Physical Anesthesia Plan  ASA: II  Anesthesia Plan: General   Post-op Pain Management:    Induction: Intravenous  PONV Risk Score and Plan: 3 and Propofol infusion, TIVA and Treatment may vary due to age or medical condition  Airway Management Planned: Natural Airway and Nasal Cannula  Additional Equipment:   Intra-op Plan:   Post-operative Plan:   Informed Consent: I have reviewed the patients History and Physical, chart, labs and discussed the procedure including the risks, benefits and alternatives for the proposed anesthesia with the patient or authorized representative who has indicated his/her understanding and acceptance.       Plan Discussed with: CRNA and Anesthesiologist  Anesthesia Plan Comments:         Anesthesia Quick Evaluation

## 2020-01-23 LAB — SARS CORONAVIRUS 2 (TAT 6-24 HRS): SARS Coronavirus 2: NEGATIVE

## 2020-01-25 NOTE — Discharge Instructions (Signed)
General Anesthesia, Adult, Care After This sheet gives you information about how to care for yourself after your procedure. Your health care provider may also give you more specific instructions. If you have problems or questions, contact your health care provider. What can I expect after the procedure? After the procedure, the following side effects are common:  Pain or discomfort at the IV site.  Nausea.  Vomiting.  Sore throat.  Trouble concentrating.  Feeling cold or chills.  Weak or tired.  Sleepiness and fatigue.  Soreness and body aches. These side effects can affect parts of the body that were not involved in surgery. Follow these instructions at home:  For at least 24 hours after the procedure:  Have a responsible adult stay with you. It is important to have someone help care for you until you are awake and alert.  Rest as needed.  Do not: ? Participate in activities in which you could fall or become injured. ? Drive. ? Use heavy machinery. ? Drink alcohol. ? Take sleeping pills or medicines that cause drowsiness. ? Make important decisions or sign legal documents. ? Take care of children on your own. Eating and drinking  Follow any instructions from your health care provider about eating or drinking restrictions.  When you feel hungry, start by eating small amounts of foods that are soft and easy to digest (bland), such as toast. Gradually return to your regular diet.  Drink enough fluid to keep your urine pale yellow.  If you vomit, rehydrate by drinking water, juice, or clear broth. General instructions  If you have sleep apnea, surgery and certain medicines can increase your risk for breathing problems. Follow instructions from your health care provider about wearing your sleep device: ? Anytime you are sleeping, including during daytime naps. ? While taking prescription pain medicines, sleeping medicines, or medicines that make you drowsy.  Return to  your normal activities as told by your health care provider. Ask your health care provider what activities are safe for you.  Take over-the-counter and prescription medicines only as told by your health care provider.  If you smoke, do not smoke without supervision.  Keep all follow-up visits as told by your health care provider. This is important. Contact a health care provider if:  You have nausea or vomiting that does not get better with medicine.  You cannot eat or drink without vomiting.  You have pain that does not get better with medicine.  You are unable to pass urine.  You develop a skin rash.  You have a fever.  You have redness around your IV site that gets worse. Get help right away if:  You have difficulty breathing.  You have chest pain.  You have blood in your urine or stool, or you vomit blood. Summary  After the procedure, it is common to have a sore throat or nausea. It is also common to feel tired.  Have a responsible adult stay with you for the first 24 hours after general anesthesia. It is important to have someone help care for you until you are awake and alert.  When you feel hungry, start by eating small amounts of foods that are soft and easy to digest (bland), such as toast. Gradually return to your regular diet.  Drink enough fluid to keep your urine pale yellow.  Return to your normal activities as told by your health care provider. Ask your health care provider what activities are safe for you. This information is not   intended to replace advice given to you by your health care provider. Make sure you discuss any questions you have with your health care provider. Document Revised: 03/22/2017 Document Reviewed: 11/02/2016 Elsevier Patient Education  2020 Elsevier Inc[employer]  

## 2020-01-26 ENCOUNTER — Encounter
Admission: RE | Disposition: A | Discharge status: Home / Self Care | Payer: Self-pay | Source: Home / Self Care | Attending: Gastroenterology

## 2020-01-26 ENCOUNTER — Ambulatory Visit: Payer: No Typology Code available for payment source | Admitting: Anesthesiology

## 2020-01-26 ENCOUNTER — Encounter: Payer: Self-pay | Admitting: Gastroenterology

## 2020-01-26 ENCOUNTER — Other Ambulatory Visit: Payer: Self-pay

## 2020-01-26 ENCOUNTER — Ambulatory Visit
Admission: RE | Admit: 2020-01-26 | Discharge: 2020-01-26 | Disposition: A | Discharge status: Home / Self Care | Payer: No Typology Code available for payment source | Attending: Gastroenterology | Admitting: Gastroenterology

## 2020-01-26 DIAGNOSIS — Z800 Family history of malignant neoplasm of digestive organs: Secondary | ICD-10-CM | POA: Diagnosis not present

## 2020-01-26 DIAGNOSIS — Z1211 Encounter for screening for malignant neoplasm of colon: Secondary | ICD-10-CM | POA: Insufficient documentation

## 2020-01-26 DIAGNOSIS — K635 Polyp of colon: Secondary | ICD-10-CM

## 2020-01-26 HISTORY — PX: POLYPECTOMY: SHX5525

## 2020-01-26 HISTORY — PX: COLONOSCOPY WITH PROPOFOL: SHX5780

## 2020-01-26 LAB — GLUCOSE, CAPILLARY
Glucose-Capillary: 90 mg/dL (ref 70–99)
Glucose-Capillary: 92 mg/dL (ref 70–99)

## 2020-01-26 SURGERY — COLONOSCOPY WITH PROPOFOL
Anesthesia: General | Site: Rectum

## 2020-01-26 MED ORDER — ACETAMINOPHEN 10 MG/ML IV SOLN: 1000.0000 mg | Freq: Once | INTRAVENOUS | Status: DC | PRN

## 2020-01-26 MED ORDER — STERILE WATER FOR IRRIGATION IR SOLN: Status: DC | PRN

## 2020-01-26 MED ORDER — LACTATED RINGERS IV SOLN: INTRAVENOUS | Status: DC

## 2020-01-26 MED ORDER — LIDOCAINE HCL (CARDIAC) PF 100 MG/5ML IV SOSY
PREFILLED_SYRINGE | INTRAVENOUS | Status: DC | PRN
  Administered 2020-01-26: 30 mg via INTRAVENOUS

## 2020-01-26 MED ORDER — ONDANSETRON HCL 4 MG/2ML IJ SOLN: 4.0000 mg | Freq: Once | INTRAMUSCULAR | Status: DC | PRN

## 2020-01-26 MED ORDER — PROPOFOL 10 MG/ML IV BOLUS
INTRAVENOUS | Status: DC | PRN
  Administered 2020-01-26 (×2): 40 mg via INTRAVENOUS
  Administered 2020-01-26: 20 mg via INTRAVENOUS
  Administered 2020-01-26: 40 mg via INTRAVENOUS
  Administered 2020-01-26: 100 mg via INTRAVENOUS
  Administered 2020-01-26: 20 mg via INTRAVENOUS
  Administered 2020-01-26 (×2): 30 mg via INTRAVENOUS
  Administered 2020-01-26: 20 mg via INTRAVENOUS
  Administered 2020-01-26: 30 mg via INTRAVENOUS

## 2020-01-26 SURGICAL SUPPLY — 9 items
FCP ESCP3.2XJMB 240X2.8X (MISCELLANEOUS) ×1
FORCEPS BIOP RJ4 240 W/NDL (MISCELLANEOUS) ×3
FORCEPS ESCP3.2XJMB 240X2.8X (MISCELLANEOUS) ×1 IMPLANT
GOWN CVR UNV OPN BCK APRN NK (MISCELLANEOUS) ×2 IMPLANT
GOWN ISOL THUMB LOOP REG UNIV (MISCELLANEOUS) ×6
KIT PRC NS LF DISP ENDO (KITS) ×1 IMPLANT
KIT PROCEDURE OLYMPUS (KITS) ×3
MANIFOLD NEPTUNE II (INSTRUMENTS) ×3 IMPLANT
WATER STERILE IRR 250ML POUR (IV SOLUTION) ×3 IMPLANT

## 2020-01-26 NOTE — Anesthesia Postprocedure Evaluation (Signed)
Anesthesia Post Note  Patient: Brittany Callahan  Procedure(s) Performed: COLONOSCOPY WITH BIOPSY (N/A Rectum) POLYPECTOMY (N/A Rectum)     Patient location during evaluation: PACU Anesthesia Type: General Level of consciousness: awake and alert Pain management: pain level controlled Vital Signs Assessment: post-procedure vital signs reviewed and stable Respiratory status: spontaneous breathing, nonlabored ventilation, respiratory function stable and patient connected to nasal cannula oxygen Cardiovascular status: blood pressure returned to baseline and stable Postop Assessment: no apparent nausea or vomiting Anesthetic complications: no   No complications documented.  Irie Dowson A  Ivannah Zody

## 2020-01-26 NOTE — Transfer of Care (Signed)
Immediate Anesthesia Transfer of Care Note  Patient: Brittany Callahan  Procedure(s) Performed: COLONOSCOPY WITH BIOPSY (N/A Rectum) POLYPECTOMY (N/A Rectum)  Patient Location: PACU  Anesthesia Type: General  Level of Consciousness: awake, alert  and patient cooperative  Airway and Oxygen Therapy: Patient Spontanous Breathing and Patient connected to supplemental oxygen  Post-op Assessment: Post-op Vital signs reviewed, Patient's Cardiovascular Status Stable, Respiratory Function Stable, Patent Airway and No signs of Nausea or vomiting  Post-op Vital Signs: Reviewed and stable  Complications: No complications documented.

## 2020-01-26 NOTE — H&P (Signed)
Brittany Bouillon, MD [address], [address], [place], [place], [address] [address], [address], [place], [place], [address] Phone: [phone]  Fax: [phone]  Primary Care Physician:  Tracey Harries, MD   Pre-Procedure History & Physical: HPI:  Brittany Callahan is a 59 y.o. female is here for a colonoscopy.   Past Medical History:  Diagnosis Date  . Diabetes mellitus without complication (HCC)   . High blood pressure   . Shingles 02/22/2018   back    Past Surgical History:  Procedure Laterality Date  . BREAST BIOPSY Left 03/25/2017   PAPILLARY APOCRINE CHANGE INVOLVING A MICROCYST CLUSTER    Prior to Admission medications   Medication Sig Start Date End Date Taking? Authorizing Provider  Calcium 500-125 MG-UNIT TABS Take by mouth.   Yes [provider]  Cholecalciferol 50 MCG (2000 UT) TABS Take by mouth. 11/11/18  Yes [provider]  Garlic Oil 1000 MG CAPS Take by mouth.   Yes [provider]  lisinopril-hydrochlorothiazide (PRINZIDE,ZESTORETIC) 20-12.5 MG tablet Take 1 tablet by mouth daily. 12/21/17  Yes Rey, Richarda Overlie, NP  metFORMIN (GLUCOPHAGE) 500 MG tablet Take 1 tablet (500 mg total) by mouth daily with breakfast. 12/21/17  Yes Rey, Raquel M, NP  Multiple Vitamins-Minerals (HAIR SKIN NAILS PO) Take 1 tablet by mouth daily.   Yes [provider]  Omega-3 Fatty Acids (FISH OIL) 1000 MG CAPS Take 3 capsules by mouth daily.   Yes [provider]  rosuvastatin (CRESTOR) 40 MG tablet Take by mouth. 12/28/19  Yes [provider]    Allergies as of 01/04/2020 - Review Complete 01/04/2020  Allergen Reaction Noted  . Other  11/01/2017    Family History  Problem Relation Age of Onset  . Dementia Mother   . Diabetes Mother   . Hypertension Mother     Social History   Socioeconomic History  . Marital status: Married    Spouse name: Not on file  . Number of children: Not on file  . Years of education: Not on  file  . Highest education level: Not on file  Occupational History  . Not on file  Tobacco Use  . Smoking status: Never Smoker  . Smokeless tobacco: Former Neurosurgeon    Types: Engineer, drilling  . Vaping Use: Never used  Substance and Sexual Activity  . Alcohol use: Never  . Drug use: Never  . Sexual activity: Not on file  Other Topics Concern  . Not on file  Social History Narrative  . Not on file   Social Determinants of Health   Financial Resource Strain:   . Difficulty of Paying Living Expenses: Not on file  Food Insecurity:   . Worried About Programme researcher, broadcasting/film/video in the Last Year: Not on file  . Ran Out of Food in the Last Year: Not on file  Transportation Needs:   . Lack of Transportation (Medical): Not on file  . Lack of Transportation (Non-Medical): Not on file  Physical Activity:   . Days of Exercise per Week: Not on file  . Minutes of Exercise per Session: Not on file  Stress:   . Feeling of Stress : Not on file  Social Connections:   . Frequency of Communication with Friends and Family: Not on file  . Frequency of Social Gatherings with Friends and Family: Not on file  . Attends Religious Services: Not on file  . Active Member of Clubs or Organizations: Not on file  .  Attends Banker Meetings: Not on file  . Marital Status: Not on file  Intimate Partner Violence:   . Fear of Current or Ex-Partner: Not on file  . Emotionally Abused: Not on file  . Physically Abused: Not on file  . Sexually Abused: Not on file    Review of Systems: See HPI, otherwise negative ROS  Physical Exam: BP (!) 135/57   Pulse 61   Temp (!) 97 F (36.1 C) (Temporal)   Ht 5\' 6"  (1.676 m)   Wt 88 kg   SpO2 100%   BMI 31.31 kg/m  General:   Alert,  pleasant and cooperative in NAD Head:  Normocephalic and atraumatic. Neck:  Supple; no masses or thyromegaly. Lungs:  Clear throughout to auscultation, normal respiratory effort.    Heart:  +S1, +S2, Regular rate and  rhythm, No edema. Abdomen:  Soft, nontender and nondistended. Normal bowel sounds, without guarding, and without rebound.   Neurologic:  Alert and  oriented x4;  grossly normal neurologically.  Impression/Plan: Brittany Callahan is here for a colonoscopy to be performed for family history of colon cancer Risks, benefits, limitations, and alternatives regarding  colonoscopy have been reviewed with the patient.  Questions have been answered.  All parties agreeable.   Almyra Free, MD  01/26/2020, 8:48 AM

## 2020-01-26 NOTE — Anesthesia Procedure Notes (Signed)
Date/Time: 01/26/2020 9:04 AM Performed by: Maree Krabbe, CRNA Pre-anesthesia Checklist: Patient identified, Emergency Drugs available, Suction available, Timeout performed and Patient being monitored Patient Re-evaluated:Patient Re-evaluated prior to induction Oxygen Delivery Method: Nasal cannula Placement Confirmation: positive ETCO2

## 2020-01-26 NOTE — Op Note (Signed)
[facility] Gastroenterology Patient Name: Brittany Callahan Procedure Date: 01/26/2020 8:55 AM MRN: [id number] Account #: [id number] Date of Birth: 29-Apr-1960 Admit Type: Outpatient Age: 59 Room: [facility] OR ROOM 01 Gender: Female Note Status: Finalized Procedure:             Colonoscopy Indications:           Screening in patient at increased risk: Family history                         of 1st-degree relative with colorectal cancer Providers:             Evanie Buckle B. Maximino Greenland MD, MD Referring MD:          Tracey Harries (Referring MD) Medicines:             Monitored Anesthesia Care Complications:         No immediate complications. Procedure:             Pre-Anesthesia Assessment:                        - ASA Grade Assessment: II - A patient with mild                         systemic disease.                        - Prior to the procedure, a History and Physical was                         performed, and patient medications, allergies and                         sensitivities were reviewed. The patient's tolerance                         of previous anesthesia was reviewed.                        - The risks and benefits of the procedure and the                         sedation options and risks were discussed with the                         patient. All questions were answered and informed                         consent was obtained.                        - Patient identification and proposed procedure were                         verified prior to the procedure by the physician, the                         nurse, the anesthesiologist, the anesthetist and the                         technician.  The procedure was verified in the                         procedure room.                        After obtaining informed consent, the colonoscope was                         passed under direct vision. Throughout the procedure,                         the patient's blood  pressure, pulse, and oxygen                         saturations were monitored continuously. The                         Colonoscope was introduced through the anus and                         advanced to the the cecum, identified by appendiceal                         orifice and ileocecal valve. The colonoscopy was                         performed with ease. The patient tolerated the                         procedure well. The quality of the bowel preparation                         was good. Findings:      The perianal and digital rectal examinations were normal.      A 4 mm polyp was found in the sigmoid colon. The polyp was sessile. The       polyp was removed with a jumbo cold forceps. Resection and retrieval       were complete.      The exam was otherwise without abnormality.      The rectum, sigmoid colon, descending colon, transverse colon, ascending       colon and cecum appeared normal.      The retroflexed view of the distal rectum and anal verge was normal and       showed no anal or rectal abnormalities. Impression:            - One 4 mm polyp in the sigmoid colon, removed with a                         jumbo cold forceps. Resected and retrieved.                        - The examination was otherwise normal.                        - The rectum, sigmoid colon, descending colon,                         transverse  colon, ascending colon and cecum are normal.                        - The distal rectum and anal verge are normal on                         retroflexion view. Recommendation:        - Await pathology results.                        - Discharge patient to home (with escort).                        - Advance diet as tolerated.                        - Continue present medications.                        - Repeat colonoscopy in 5 years.                        - The findings and recommendations were discussed with                         the patient.                         - The findings and recommendations were discussed with                         the patient's family.                        - Return to primary care physician as previously                         scheduled. Procedure Code(s):     --- Professional ---                        [phone], Colonoscopy, flexible; with biopsy, single or                         multiple Diagnosis Code(s):     --- Professional ---                        Z80.0, Family history of malignant neoplasm of                         digestive organs                        K63.5, Polyp of colon CPT copyright 2019 American Medical Association. All rights reserved. The codes documented in this report are preliminary and upon coder review may  be revised to meet current compliance requirements.  Melodie Bouillon, MD Michel Bickers B. Maximino Greenland MD, MD 01/26/2020 9:28:33 AM This report has been signed electronically. Number of Addenda: 0 Note Initiated On: 01/26/2020 8:55 AM Scope Withdrawal Time: 0 hours 12 minutes 15 seconds  Total Procedure Duration: 0 hours 16 minutes 5 seconds  Estimated Blood  Loss:  Estimated blood loss: none.      [facility]

## 2020-01-27 LAB — SURGICAL PATHOLOGY

## 2020-01-28 ENCOUNTER — Encounter: Payer: Self-pay | Admitting: Gastroenterology

## 2020-02-08 ENCOUNTER — Telehealth: Payer: Self-pay

## 2020-02-08 NOTE — Telephone Encounter (Signed)
Called patient and she just wanted to make sure that her polyps were not cancerous. I told her that it was correct but that Dr. Maximino Greenland would like to recheck in 5 years to make sure she did not have additional polyps. Patient understood and had no further questions.

## 2020-02-08 NOTE — Telephone Encounter (Signed)
-----   Message from Wilnette Kales, [place] sent at 02/05/2020 11:46 AM EDT ----- Regarding: Patient call Kandis Cocking,  Patient left voicemail saying she has questions regarding her colonoscopy report. She can be reached at [phone].   -Jovon

## 2020-02-24 ENCOUNTER — Encounter: Payer: Self-pay | Admitting: Family Medicine

## 2020-02-24 ENCOUNTER — Ambulatory Visit: Payer: Self-pay | Admitting: Family Medicine

## 2020-02-24 ENCOUNTER — Other Ambulatory Visit: Payer: Self-pay

## 2020-02-24 DIAGNOSIS — Z113 Encounter for screening for infections with a predominantly sexual mode of transmission: Secondary | ICD-10-CM

## 2020-02-24 LAB — WET PREP FOR TRICH, YEAST, CLUE
Trichomonas Exam: NEGATIVE
Yeast Exam: NEGATIVE

## 2020-02-24 MED ORDER — FLUCONAZOLE 150 MG PO TABS: 150.0000 mg | ORAL_TABLET | Freq: Once | ORAL | 0 refills | Status: AC

## 2020-02-24 NOTE — Progress Notes (Signed)
[facility] Department STI clinic/screening visit  Subjective:  Brittany Callahan is a 59 y.o. female being seen today for an STI screening visit. The patient reports they do have symptoms.  Patient reports that they do not desire a pregnancy in the next year.   They reported they are not interested in discussing contraception today.  No LMP recorded. Patient is postmenopausal.   Patient has the following medical conditions:   Patient Active Problem List   Diagnosis Date Noted  . Family history of colon cancer   . Polyp of sigmoid colon   . Urine test positive for microalbuminuria 12/28/2019  . Type 2 diabetes mellitus without complication, without long-term current use of insulin (HCC) 11/06/2018  . Vitamin D deficiency 11/06/2018  . Hypertension associated with diabetes (HCC) 12/21/2017  . Prediabetes 12/21/2017  . Diabetes (HCC) 11/02/2017    Chief Complaint  Patient presents with  . SEXUALLY TRANSMITTED DISEASE    screening     HPI  Patient reports she thinks she has a yeast infection.  Reports was treated for + trich in August 2021 and has untreated partner but has not re-engaged with parnter.  Patient still has vaginal itching and has tried OTC 7 day yeast cream but still has intermittent itching.  Last used was >1 month ago.   Last HIV test per patient/review of record was negative Patient reports last pap with  was 03/20/2017.    See flowsheet for further details and programmatic requirements.    The following portions of the patient's history were reviewed and updated as appropriate: allergies, current medications, past medical history, past social history, past surgical history and problem list.  Objective:  There were no vitals filed for this visit.  Physical Exam Vitals and nursing note reviewed.  Constitutional:      Appearance: Normal appearance.  HENT:     Head: Normocephalic and atraumatic.     Mouth/Throat:     Mouth: Mucous membranes are  moist.     Pharynx: Oropharynx is clear. No oropharyngeal exudate or posterior oropharyngeal erythema.     Comments: Teeth missing  Pulmonary:     Effort: Pulmonary effort is normal.  Abdominal:     General: Abdomen is flat.     Palpations: There is no mass.     Tenderness: There is no abdominal tenderness. There is no guarding or rebound.  Genitourinary:    General: Normal vulva.     Exam position: Lithotomy position.     Pubic Area: No rash or pubic lice.      Labia:        Right: No rash or lesion.        Left: No rash or lesion.      Vagina: Normal. No vaginal discharge, erythema, bleeding or lesions.     Cervix: No cervical motion tenderness, discharge, friability, lesion or erythema.     Uterus: Normal.      Adnexa: Right adnexa normal and left adnexa normal.     Rectum: Normal.     Comments: .External genitalia without, lice, nits, erythema, edema , lesions or inguinal adenopathy. Vagina with normal mucosa and discharge and pH equals 4.  Cervix without visual lesions, uterus firm, mobile, non-tender, no masses, CMT adnexal fullness or tenderness. Musculoskeletal:     Cervical back: Normal range of motion.  Lymphadenopathy:     Head:     Right side of head: No preauricular or posterior auricular adenopathy.     Left side  of head: No preauricular or posterior auricular adenopathy.     Cervical: No cervical adenopathy.     Upper Body:     Right upper body: No supraclavicular or axillary adenopathy.     Left upper body: No supraclavicular or axillary adenopathy.     Lower Body: No right inguinal adenopathy. No left inguinal adenopathy.  Skin:    General: Skin is warm and dry.     Findings: No rash.  Neurological:     Mental Status: She is alert and oriented to person, place, and time.  Psychiatric:        Mood and Affect: Mood normal.        Behavior: Behavior normal.      Assessment and Plan:  Brittany Callahan is a 59 y.o. female presenting to the [facility]  Department for STI screening  1. Screening examination for venereal disease - HIV/HCV [facility] - Chlamydia/Gonorrhea [facility] - WET PREP FOR TRICH, YEAST, CLUE - Syphilis Serology, [facility]  .Patient accepted all screenings including wet prep,  vaginal CT/GC and bloodwork for HIV/ HCV /RPR.  Patient meets criteria for HepB screening? No. Ordered? No - does not meet critera  Patient meets criteria for HepC screening? Yes. Ordered? Yes  Wet prep results negative     Patient treated empirically for yeast with  fluconazole (DIFLUCAN) 150 MG tablet; Take 1 tablet (150 mg total) by mouth once for 1 dose.  Dispense: 1 tablet; Refill: 0  The patient was dispensed diflucan 150 mg  today. I provided counseling today regarding the medication. We discussed the medication, the side effects and when to call clinic. Patient given the opportunity to ask questions. Questions answered.   Discussed time line for [facility] results and that patient will be called with positive results and encouraged patient to call if she had not heard in 2 weeks.  Counseled to return or seek care for continued or worsening symptoms Recommended condom use with all sex  Patient is post menopausal and can no longer get pregnant.  .   Return if symptoms worsen or fail to improve, for 14 day for test results .  No future appointments.  Wendi Snipes, FNP

## 2020-02-24 NOTE — Progress Notes (Signed)
Presents for STD screen. Reports symptoms today. Results reviewed with provider, treated empirically for yeast. Sharlyne Pacas, RN

## 2020-02-28 NOTE — Progress Notes (Signed)
Chart reviewed by Pharmacist  Suzanne Walker PharmD, Contract Pharmacist at [facility]  

## 2020-03-04 LAB — HM HIV SCREENING LAB: HM HIV Screening: NEGATIVE

## 2020-03-04 LAB — HM HEPATITIS C SCREENING LAB: HM Hepatitis Screen: NEGATIVE

## 2020-06-29 ENCOUNTER — Other Ambulatory Visit: Payer: Self-pay | Admitting: Physician Assistant

## 2020-06-29 DIAGNOSIS — Z1231 Encounter for screening mammogram for malignant neoplasm of breast: Secondary | ICD-10-CM

## 2020-07-18 ENCOUNTER — Other Ambulatory Visit: Payer: Self-pay

## 2020-07-18 ENCOUNTER — Ambulatory Visit
Admission: RE | Admit: 2020-07-18 | Discharge: 2020-07-18 | Disposition: A | Discharge status: Home / Self Care | Payer: PRIVATE HEALTH INSURANCE | Source: Ambulatory Visit | Attending: Physician Assistant | Admitting: Physician Assistant

## 2020-07-18 DIAGNOSIS — Z1231 Encounter for screening mammogram for malignant neoplasm of breast: Secondary | ICD-10-CM | POA: Diagnosis present

## 2021-06-12 ENCOUNTER — Other Ambulatory Visit: Payer: Self-pay | Admitting: Physician Assistant

## 2021-06-12 DIAGNOSIS — Z1231 Encounter for screening mammogram for malignant neoplasm of breast: Secondary | ICD-10-CM

## 2021-07-24 ENCOUNTER — Ambulatory Visit
Admission: RE | Admit: 2021-07-24 | Discharge: 2021-07-24 | Disposition: A | Discharge status: Home / Self Care | Payer: PRIVATE HEALTH INSURANCE | Source: Ambulatory Visit | Attending: Physician Assistant | Admitting: Physician Assistant

## 2021-07-24 DIAGNOSIS — Z1231 Encounter for screening mammogram for malignant neoplasm of breast: Secondary | ICD-10-CM | POA: Diagnosis present

## 2022-03-14 ENCOUNTER — Ambulatory Visit: Payer: Self-pay

## 2022-05-17 ENCOUNTER — Other Ambulatory Visit: Payer: Self-pay | Admitting: Physician Assistant

## 2022-05-17 DIAGNOSIS — Z1231 Encounter for screening mammogram for malignant neoplasm of breast: Secondary | ICD-10-CM

## 2022-07-26 ENCOUNTER — Ambulatory Visit
Admission: RE | Admit: 2022-07-26 | Discharge: 2022-07-26 | Disposition: A | Discharge status: Home / Self Care | Payer: 59 | Source: Ambulatory Visit | Attending: Physician Assistant | Admitting: Physician Assistant

## 2022-07-26 DIAGNOSIS — Z1231 Encounter for screening mammogram for malignant neoplasm of breast: Secondary | ICD-10-CM | POA: Diagnosis not present

## 2023-05-07 ENCOUNTER — Other Ambulatory Visit: Payer: Self-pay | Admitting: Internal Medicine

## 2023-05-07 ENCOUNTER — Encounter: Payer: Self-pay | Admitting: Ophthalmology

## 2023-05-07 DIAGNOSIS — Z1231 Encounter for screening mammogram for malignant neoplasm of breast: Secondary | ICD-10-CM

## 2023-07-10 ENCOUNTER — Emergency Department
Admission: EM | Admit: 2023-07-10 | Discharge: 2023-07-10 | Disposition: A | Discharge status: Home / Self Care | Attending: Emergency Medicine | Admitting: Emergency Medicine

## 2023-07-10 ENCOUNTER — Other Ambulatory Visit: Payer: Self-pay

## 2023-07-10 ENCOUNTER — Emergency Department

## 2023-07-10 DIAGNOSIS — Y92410 Unspecified street and highway as the place of occurrence of the external cause: Secondary | ICD-10-CM | POA: Diagnosis not present

## 2023-07-10 DIAGNOSIS — S161XXA Strain of muscle, fascia and tendon at neck level, initial encounter: Secondary | ICD-10-CM

## 2023-07-10 DIAGNOSIS — I10 Essential (primary) hypertension: Secondary | ICD-10-CM | POA: Insufficient documentation

## 2023-07-10 DIAGNOSIS — M791 Myalgia, unspecified site: Secondary | ICD-10-CM | POA: Insufficient documentation

## 2023-07-10 DIAGNOSIS — R0789 Other chest pain: Secondary | ICD-10-CM | POA: Insufficient documentation

## 2023-07-10 DIAGNOSIS — E119 Type 2 diabetes mellitus without complications: Secondary | ICD-10-CM | POA: Diagnosis not present

## 2023-07-10 DIAGNOSIS — M7918 Myalgia, other site: Secondary | ICD-10-CM

## 2023-07-10 MED ORDER — LIDOCAINE 5 % EX PTCH
1.0000 | MEDICATED_PATCH | CUTANEOUS | Status: DC
  Administered 2023-07-10: 1 via TRANSDERMAL
  Filled 2023-07-10: qty 1

## 2023-07-10 MED ORDER — CYCLOBENZAPRINE HCL 10 MG PO TABS
5.0000 mg | ORAL_TABLET | Freq: Once | ORAL | Status: AC
Start: 2023-07-10 — End: 2023-07-10
  Administered 2023-07-10: 5 mg via ORAL
  Filled 2023-07-10: qty 1

## 2023-07-10 MED ORDER — ACETAMINOPHEN 325 MG PO TABS
650.0000 mg | ORAL_TABLET | Freq: Once | ORAL | Status: AC
  Administered 2023-07-10: 650 mg via ORAL
  Filled 2023-07-10: qty 2

## 2023-07-10 MED ORDER — KETOROLAC TROMETHAMINE 30 MG/ML IJ SOLN
30.0000 mg | Freq: Once | INTRAMUSCULAR | Status: AC
Start: 2023-07-10 — End: 2023-07-10
  Administered 2023-07-10: 30 mg via INTRAMUSCULAR
  Filled 2023-07-10: qty 1

## 2023-07-10 MED ORDER — CYCLOBENZAPRINE HCL 5 MG PO TABS: 5.0000 mg | ORAL_TABLET | Freq: Three times a day (TID) | ORAL | 0 refills | Status: AC | PRN
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
## 2023-07-29 ENCOUNTER — Ambulatory Visit: Admission: RE | Admit: 2023-07-29 | Payer: 59 | Source: Ambulatory Visit

## 2023-08-02 ENCOUNTER — Other Ambulatory Visit: Payer: Self-pay | Admitting: Family Medicine

## 2023-08-02 DIAGNOSIS — N644 Mastodynia: Secondary | ICD-10-CM

## 2023-08-06 ENCOUNTER — Ambulatory Visit
Admission: RE | Admit: 2023-08-06 | Discharge: 2023-08-06 | Disposition: A | Discharge status: Home / Self Care | Source: Ambulatory Visit | Attending: Family Medicine | Admitting: Family Medicine

## 2023-08-06 DIAGNOSIS — N644 Mastodynia: Secondary | ICD-10-CM

## 2023-08-27 ENCOUNTER — Encounter: Payer: Self-pay | Admitting: Physical Medicine & Rehabilitation

## 2023-10-22 ENCOUNTER — Encounter: Admitting: Physical Medicine & Rehabilitation

## 2023-11-07 ENCOUNTER — Emergency Department

## 2023-11-07 ENCOUNTER — Other Ambulatory Visit: Payer: Self-pay

## 2023-11-07 ENCOUNTER — Emergency Department
Admission: EM | Admit: 2023-11-07 | Discharge: 2023-11-07 | Disposition: A | Discharge status: Home / Self Care | Attending: Emergency Medicine | Admitting: Emergency Medicine

## 2023-11-07 DIAGNOSIS — E119 Type 2 diabetes mellitus without complications: Secondary | ICD-10-CM | POA: Diagnosis not present

## 2023-11-07 DIAGNOSIS — M545 Low back pain, unspecified: Secondary | ICD-10-CM | POA: Diagnosis not present

## 2023-11-07 DIAGNOSIS — S0990XA Unspecified injury of head, initial encounter: Secondary | ICD-10-CM | POA: Diagnosis present

## 2023-11-07 DIAGNOSIS — M542 Cervicalgia: Secondary | ICD-10-CM | POA: Diagnosis not present

## 2023-11-07 DIAGNOSIS — Y92410 Unspecified street and highway as the place of occurrence of the external cause: Secondary | ICD-10-CM | POA: Insufficient documentation

## 2023-11-07 DIAGNOSIS — I10 Essential (primary) hypertension: Secondary | ICD-10-CM | POA: Insufficient documentation

## 2023-11-07 DIAGNOSIS — S0083XA Contusion of other part of head, initial encounter: Secondary | ICD-10-CM | POA: Diagnosis not present

## 2023-11-07 DIAGNOSIS — M546 Pain in thoracic spine: Secondary | ICD-10-CM | POA: Insufficient documentation

## 2023-11-07 LAB — CBC WITH DIFFERENTIAL/PLATELET
Abs Immature Granulocytes: 0.01 K/uL (ref 0.00–0.07)
Basophils Absolute: 0 K/uL (ref 0.0–0.1)
Basophils Relative: 1 %
Eosinophils Absolute: 0.1 K/uL (ref 0.0–0.5)
Eosinophils Relative: 2 %
HCT: 38.4 % (ref 36.0–46.0)
Hemoglobin: 12.1 g/dL (ref 12.0–15.0)
Immature Granulocytes: 0 %
Lymphocytes Relative: 35 %
Lymphs Abs: 1.4 K/uL (ref 0.7–4.0)
MCH: 26.1 pg (ref 26.0–34.0)
MCHC: 31.5 g/dL (ref 30.0–36.0)
MCV: 82.9 fL (ref 80.0–100.0)
Monocytes Absolute: 0.5 K/uL (ref 0.1–1.0)
Monocytes Relative: 12 %
Neutro Abs: 1.9 K/uL (ref 1.7–7.7)
Neutrophils Relative %: 50 %
Platelets: 217 K/uL (ref 150–400)
RBC: 4.63 MIL/uL (ref 3.87–5.11)
RDW: 13.8 % (ref 11.5–15.5)
WBC: 3.8 K/uL — ABNORMAL LOW (ref 4.0–10.5)
nRBC: 0 % (ref 0.0–0.2)

## 2023-11-07 LAB — COMPREHENSIVE METABOLIC PANEL WITH GFR
ALT: 24 U/L (ref 0–44)
AST: 19 U/L (ref 15–41)
Albumin: 3.9 g/dL (ref 3.5–5.0)
Alkaline Phosphatase: 93 U/L (ref 38–126)
Anion gap: 6 (ref 5–15)
BUN: 13 mg/dL (ref 8–23)
CO2: 25 mmol/L (ref 22–32)
Calcium: 8.9 mg/dL (ref 8.9–10.3)
Chloride: 107 mmol/L (ref 98–111)
Creatinine, Ser: 0.63 mg/dL (ref 0.44–1.00)
GFR, Estimated: >60 mL/min (ref >60)
Glucose, Bld: 102 mg/dL — ABNORMAL HIGH (ref 70–99)
Potassium: 3.9 mmol/L (ref 3.5–5.1)
Sodium: 138 mmol/L (ref 135–145)
Total Bilirubin: 0.7 mg/dL (ref 0.0–1.2)
Total Protein: 8 g/dL (ref 6.5–8.1)

## 2023-11-07 LAB — LIPASE, BLOOD: Lipase: 23 U/L (ref 11–51)

## 2023-11-07 LAB — TROPONIN I (HIGH SENSITIVITY): Troponin I (High Sensitivity): 4 ng/L (ref <18)

## 2023-11-07 MED ORDER — IOHEXOL 300 MG/ML  SOLN
100.0000 mL | Freq: Once | INTRAMUSCULAR | Status: AC | PRN
  Administered 2023-11-07: 100 mL via INTRAVENOUS

## 2023-11-07 MED ORDER — ACETAMINOPHEN 325 MG PO TABS
650.0000 mg | ORAL_TABLET | Freq: Once | ORAL | Status: AC
  Administered 2023-11-07: 650 mg via ORAL
  Filled 2023-11-07: qty 2

## 2023-11-07 MED ORDER — KETOROLAC TROMETHAMINE 15 MG/ML IJ SOLN
15.0000 mg | Freq: Once | INTRAMUSCULAR | Status: AC
  Administered 2023-11-07: 15 mg via INTRAVENOUS
  Filled 2023-11-07: qty 1

## 2023-11-07 NOTE — ED Provider Notes (Signed)
-----------------------------------------   2:56 PM on 11/07/2023 -----------------------------------------  Blood pressure (!) 176/70, pulse 65, temperature 98 F (36.7 C), temperature source Oral, resp. rate 19, height 5' 6 (1.676 m), weight 88 kg, SpO2 98%.  Assuming care from Jenna Poggi, PA-C.  In short, Brittany Callahan is a 63 y.o. female with a chief complaint of Optician, dispensing .  Refer to the original H&P for additional details.  The current plan of care is to awaiting CT chest abdomen and pelvis and make appropriate disposition.  ----------------------------------------- 3:41 PM on 11/07/2023 ----------------------------------------- Reassuring CT chest abdomen pelvis.  Results updated to patient.  CT cervical spine is normal.  C-collar removed.  Patient requesting pain medication.  Will administer Toradol  and Tylenol  before d/c. She is in stable condition for d/c home.         Margrette, Maalle Starrett A, PA-C 11/07/23 1543    Claudene Rover, MD 11/07/23 [phone]
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
## 2024-05-28 ENCOUNTER — Ambulatory Visit: Admitting: Family Medicine
